# Patient Record
Sex: Female | Born: 2005 | Race: White | Hispanic: No | Marital: Single | Smoking: Never smoker
Health system: Southern US, Community
[De-identification: ages and names within clinical notes are randomized; demographics above are authoritative.]

## PROBLEM LIST (undated history)

## (undated) DIAGNOSIS — F909 Attention-deficit hyperactivity disorder, unspecified type: Secondary | ICD-10-CM

## (undated) DIAGNOSIS — F419 Anxiety disorder, unspecified: Secondary | ICD-10-CM

---
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
## 2024-10-21 ENCOUNTER — Other Ambulatory Visit: Payer: Self-pay

## 2024-10-21 ENCOUNTER — Emergency Department

## 2024-10-21 ENCOUNTER — Emergency Department
Admission: EM | Admit: 2024-10-21 | Discharge: 2024-10-21 | Disposition: A | Discharge status: Home / Self Care | Attending: Emergency Medicine | Admitting: Emergency Medicine

## 2024-10-21 DIAGNOSIS — R7401 Elevation of levels of liver transaminase levels: Secondary | ICD-10-CM | POA: Insufficient documentation

## 2024-10-21 DIAGNOSIS — N12 Tubulo-interstitial nephritis, not specified as acute or chronic: Secondary | ICD-10-CM | POA: Diagnosis not present

## 2024-10-21 DIAGNOSIS — R109 Unspecified abdominal pain: Secondary | ICD-10-CM | POA: Diagnosis present

## 2024-10-21 DIAGNOSIS — R1010 Upper abdominal pain, unspecified: Secondary | ICD-10-CM

## 2024-10-21 DIAGNOSIS — D72829 Elevated white blood cell count, unspecified: Secondary | ICD-10-CM | POA: Diagnosis not present

## 2024-10-21 DIAGNOSIS — R748 Abnormal levels of other serum enzymes: Secondary | ICD-10-CM

## 2024-10-21 LAB — URINALYSIS, ROUTINE W REFLEX MICROSCOPIC
Bilirubin Urine: NEGATIVE
Glucose, UA: NEGATIVE mg/dL
Ketones, ur: NEGATIVE mg/dL
Leukocytes,Ua: NEGATIVE
Nitrite: NEGATIVE
Protein, ur: NEGATIVE mg/dL
Specific Gravity, Urine: 1.016 (ref 1.005–1.030)
pH: 5 (ref 5.0–8.0)

## 2024-10-21 LAB — COMPREHENSIVE METABOLIC PANEL WITH GFR
ALT: 520 U/L — ABNORMAL HIGH (ref 0–44)
AST: 389 U/L — ABNORMAL HIGH (ref 15–41)
Albumin: 4 g/dL (ref 3.5–5.0)
Alkaline Phosphatase: 486 U/L — ABNORMAL HIGH (ref 38–126)
Anion gap: 13 (ref 5–15)
BUN: 12 mg/dL (ref 6–20)
CO2: 22 mmol/L (ref 22–32)
Calcium: 9.2 mg/dL (ref 8.9–10.3)
Chloride: 103 mmol/L (ref 98–111)
Creatinine, Ser: 0.71 mg/dL (ref 0.44–1.00)
GFR, Estimated: >60 mL/min
Glucose, Bld: 97 mg/dL (ref 70–99)
Potassium: 3.8 mmol/L (ref 3.5–5.1)
Sodium: 138 mmol/L (ref 135–145)
Total Bilirubin: 2.5 mg/dL — ABNORMAL HIGH (ref 0.0–1.2)
Total Protein: 8 g/dL (ref 6.5–8.1)

## 2024-10-21 LAB — CBC
HCT: 38.5 % (ref 36.0–46.0)
Hemoglobin: 12.8 g/dL (ref 12.0–15.0)
MCH: 29.4 pg (ref 26.0–34.0)
MCHC: 33.2 g/dL (ref 30.0–36.0)
MCV: 88.5 fL (ref 80.0–100.0)
Platelets: 159 10*3/uL (ref 150–400)
RBC: 4.35 MIL/uL (ref 3.87–5.11)
RDW: 13.5 % (ref 11.5–15.5)
WBC: 18.1 10*3/uL — ABNORMAL HIGH (ref 4.0–10.5)
nRBC: 0 % (ref 0.0–0.2)

## 2024-10-21 LAB — LIPASE, BLOOD: Lipase: 22 U/L (ref 11–51)

## 2024-10-21 LAB — POC URINE PREG, ED: Preg Test, Ur: NEGATIVE

## 2024-10-21 MED ORDER — LEVOFLOXACIN 750 MG PO TABS: 750.0000 mg | ORAL_TABLET | Freq: Every day | ORAL | 0 refills | Status: AC

## 2024-10-21 MED ORDER — ONDANSETRON HCL 4 MG/2ML IJ SOLN
4.0000 mg | Freq: Once | INTRAMUSCULAR | Status: AC
  Administered 2024-10-21: 4 mg via INTRAVENOUS
  Filled 2024-10-21: qty 2

## 2024-10-21 MED ORDER — PIPERACILLIN-TAZOBACTAM 3.375 G IVPB 30 MIN
3.3750 g | Freq: Once | INTRAVENOUS | Status: AC
  Administered 2024-10-21: 3.375 g via INTRAVENOUS
  Filled 2024-10-21: qty 50

## 2024-10-21 MED ORDER — IOHEXOL 300 MG/ML  SOLN
100.0000 mL | Freq: Once | INTRAMUSCULAR | Status: AC | PRN
  Administered 2024-10-21: 100 mL via INTRAVENOUS

## 2024-10-21 MED ORDER — ONDANSETRON 4 MG PO TBDP: 4.0000 mg | ORAL_TABLET | Freq: Three times a day (TID) | ORAL | 0 refills | Status: AC | PRN

## 2024-10-21 MED ORDER — SODIUM CHLORIDE 0.9 % IV BOLUS
1000.0000 mL | Freq: Once | INTRAVENOUS | Status: AC
  Administered 2024-10-21: 1000 mL via INTRAVENOUS

## 2024-10-21 NOTE — ED Notes (Signed)
 US  at beside

## 2024-10-21 NOTE — ED Triage Notes (Signed)
 First nurse note: pt to ED [facility], [facility] for constipation x8 days. +nausea. +h/a

## 2024-10-21 NOTE — ED Triage Notes (Signed)
 Pt sts that she has been having constipation with nausea for the last 8 days.

## 2024-10-21 NOTE — ED Provider Notes (Signed)
 "  [facility] Provider Note    Event Date/Time   First MD Initiated Contact with Patient 10/21/24 1854     (approximate)  History   Chief Complaint: Constipation  HPI  Tiffany Donaldson is a 19 y.o. female with a past medical history of ADHD, anxiety, presents to the emergency department for abdominal discomfort constipation and nausea.  According to the patient for the past 8 or 9 days she has been constipated experiencing vague abdominal pains.  Patient states she continues to have constipation now with nausea abdominal pain is focused more on the right side of her abdomen and tonight had a low-grade temperature to 100.0.  Patient went to [facility] clinic and was sent to the emergency department for further evaluation.  Has also noticed some slight yellowing of her eyes at times, as well as some upper respiratory congestion recently.  Physical Exam   Triage Vital Signs: ED Triage Vitals [10/21/24 1639]  Encounter Vitals Group     BP 131/78     Girls Systolic BP Percentile      Girls Diastolic BP Percentile      Boys Systolic BP Percentile      Boys Diastolic BP Percentile      Pulse Rate 71     Resp 18     Temp 98.8 F (37.1 C)     Temp Source Oral     SpO2 96 %     Weight 140 lb (63.5 kg)     Height 5' 6 (1.676 m)     Head Circumference      Peak Flow      Pain Score 2     Pain Loc      Pain Education      Exclude from Growth Chart     Most recent vital signs: Vitals:   10/21/24 1639  BP: 131/78  Pulse: 71  Resp: 18  Temp: 98.8 F (37.1 C)  SpO2: 96%    General: Awake, no distress.  Very slight scleral icterus. CV:  Good peripheral perfusion.  Regular rate and rhythm  Resp:  Normal effort.  Equal breath sounds bilaterally.  Abd:  No distention.  Soft, mild right-sided abdominal tenderness no rebound or guarding.  ED Results / Procedures / Treatments   RADIOLOGY  Ultrasound pending   MEDICATIONS ORDERED IN ED: Medications - No  data to display   IMPRESSION / MDM / ASSESSMENT AND PLAN / ED COURSE  I reviewed the triage vital signs and the nursing notes.  Patient's presentation is most consistent with acute presentation with potential threat to life or bodily function.  Patient presents emergency department for 8 days of constipation now with some right sided abdominal discomfort nausea and low-grade temperature to 100.0.  Here in the emergency department patient appears well, reassuring physical exam with just very minimal right sided abdominal tenderness.  Vital signs are reassuring, afebrile in the emergency department but had a fever of 100.0 in [place] clinic just before arrival.  Patient's lab work today shows a leukocytosis of 18,000 with a chemistry showing elevated LFTs including a total bilirubin of 2.5.  Lipase is normal.  Concern for possible cholecystitis versus choledocholithiasis versus biliary obstruction.  We will go ahead and cover with IV Zosyn  given the patient's temperature of 100.0 earlier today and leukocytosis of 18,000 to cover for any possible cholecystitis or cholangitis.  Will obtain a right upper quadrant ultrasound to further evaluate.  Patient agreeable to plan of  care and workup.  Ultrasound pending.  Patient care signed out to oncoming provider.  FINAL CLINICAL IMPRESSION(S) / ED DIAGNOSES   Elevated liver function test Abdominal pain   Note:  This document was prepared using Dragon voice recognition software and may include unintentional dictation errors.   Dorothyann Drivers, MD 10/22/24 2340  "

## 2024-10-21 NOTE — ED Provider Notes (Signed)
----------------------------------------- °  8:02 PM on 10/21/2024 -----------------------------------------  Blood pressure 131/78, pulse 71, temperature 98.8 F (37.1 C), temperature source Oral, resp. rate 18, height 5' 6 (1.676 m), weight 63.5 kg, last menstrual period 10/21/2024, SpO2 96%.  Assuming care from Dr. Dorothyann.  In short, Tiffany Donaldson is a 19 y.o. female with a chief complaint of constipation x 8 days.  Refer to the original H&P for additional details.  The current plan of care is to await US  results and proceed based on findings.   Clinical Course as of 10/21/24 2329  Tue Oct 21, 2024  2058 US  negative. Plan will be to get CT abdomen and pelvis with contrast. Upon reassessment, patient lying in bed in relaxed position. Declines pain/nausea medication at this time. Agreeable with plan for CT. [CT]  2325 CT concerning for pyelonephritis, but otherwise negative.  Upon reexamination, patient continues to be overall well-appearing.  Her abdomen remains soft.  She has some diffuse right sided abdominal tenderness that is somewhat worse in the upper quadrant.  Tiffany Donaldson is still negative.  She does state that she feels nauseated.  Zofran  ordered.  She has no back pain or CVA tenderness.  She denies urinary symptoms as well.  She states that she had discussed her constipation with primary care about 3 days ago who advised her to take MiraLAX.  After 1 dose, her stool became soft and it had a claylike appearance.  She did not take any more after that.  Case discussed with oncoming ED attending, Dr. Viviann.  Plan will be to get a urine culture, treat with antibiotic and have her follow-up with her primary care provider for repeat labs and assessment this week if possible.  She will be given strict ER return precautions as well. [CT]    Clinical Course User Index [CT] Tasnia Spegal B, FNP     Medications  ondansetron  (ZOFRAN ) injection 4 mg (has no administration in  time range)  piperacillin -tazobactam (ZOSYN ) IVPB 3.375 g (0 g Intravenous Stopped 10/21/24 2052)  sodium chloride  0.9 % bolus 1,000 mL (0 mLs Intravenous Stopped 10/21/24 2157)  iohexol  (OMNIPAQUE ) 300 MG/ML solution 100 mL (100 mLs Intravenous Contrast Given 10/21/24 2241)     ED Discharge Orders          Ordered    ondansetron  (ZOFRAN -ODT) 4 MG disintegrating tablet  Every 8 hours PRN        10/21/24 2324    levofloxacin  (LEVAQUIN ) 750 MG tablet  Daily        10/21/24 2324           Final diagnoses:  Pyelonephritis  Pain of upper abdomen  Elevated liver enzymes      Herlinda Kirk NOVAK, FNP 10/21/24 2329    Dorothyann Drivers, MD 10/22/24 2330

## 2024-10-21 NOTE — Discharge Instructions (Signed)
 Please call tomorrow to request a follow-up appointment with your primary care provider.  If any of your symptoms change or worsen, please return to the emergency department if unable to see primary care right away.  Take the antibiotic as prescribed until finished.  Do not take acetaminophen /Tylenol  or over-the-counter medications that contain either of the 2.  Do not drink alcohol.

## 2024-10-22 ENCOUNTER — Emergency Department (HOSPITAL_BASED_OUTPATIENT_CLINIC_OR_DEPARTMENT_OTHER)
Admission: EM | Admit: 2024-10-22 | Discharge: 2024-10-23 | Disposition: A | Discharge status: Home / Self Care | Attending: Emergency Medicine | Admitting: Emergency Medicine

## 2024-10-22 ENCOUNTER — Other Ambulatory Visit: Payer: Self-pay

## 2024-10-22 DIAGNOSIS — R42 Dizziness and giddiness: Secondary | ICD-10-CM | POA: Insufficient documentation

## 2024-10-22 DIAGNOSIS — N12 Tubulo-interstitial nephritis, not specified as acute or chronic: Secondary | ICD-10-CM | POA: Insufficient documentation

## 2024-10-22 DIAGNOSIS — R110 Nausea: Secondary | ICD-10-CM | POA: Insufficient documentation

## 2024-10-22 NOTE — ED Triage Notes (Signed)
 POV nausea and vomiting last 6 days, dizziness started to get worse tonight.  Seen at [facility] yesterday Dx with pyelonephritis and given zofran  and Levaquin  at D/c

## 2024-10-23 LAB — COMPREHENSIVE METABOLIC PANEL WITH GFR
ALT: 442 U/L — ABNORMAL HIGH (ref 0–44)
AST: 273 U/L — ABNORMAL HIGH (ref 15–41)
Albumin: 4 g/dL (ref 3.5–5.0)
Alkaline Phosphatase: 512 U/L — ABNORMAL HIGH (ref 38–126)
Anion gap: 12 (ref 5–15)
BUN: 10 mg/dL (ref 6–20)
CO2: 25 mmol/L (ref 22–32)
Calcium: 9.2 mg/dL (ref 8.9–10.3)
Chloride: 100 mmol/L (ref 98–111)
Creatinine, Ser: 0.72 mg/dL (ref 0.44–1.00)
GFR, Estimated: >60 mL/min
Glucose, Bld: 90 mg/dL (ref 70–99)
Potassium: 3.5 mmol/L (ref 3.5–5.1)
Sodium: 137 mmol/L (ref 135–145)
Total Bilirubin: 1.8 mg/dL — ABNORMAL HIGH (ref 0.0–1.2)
Total Protein: 7.5 g/dL (ref 6.5–8.1)

## 2024-10-23 LAB — CBC WITH DIFFERENTIAL/PLATELET
Abs Immature Granulocytes: 0.57 10*3/uL — ABNORMAL HIGH (ref 0.00–0.07)
Basophils Absolute: 0.2 10*3/uL — ABNORMAL HIGH (ref 0.0–0.1)
Basophils Relative: 1 %
Eosinophils Absolute: 0.2 10*3/uL (ref 0.0–0.5)
Eosinophils Relative: 2 %
HCT: 37 % (ref 36.0–46.0)
Hemoglobin: 12.4 g/dL (ref 12.0–15.0)
Immature Granulocytes: 4 %
Lymphocytes Relative: 63 %
Lymphs Abs: 8.3 10*3/uL — ABNORMAL HIGH (ref 0.7–4.0)
MCH: 29.7 pg (ref 26.0–34.0)
MCHC: 33.5 g/dL (ref 30.0–36.0)
MCV: 88.7 fL (ref 80.0–100.0)
Monocytes Absolute: 1.1 10*3/uL — ABNORMAL HIGH (ref 0.1–1.0)
Monocytes Relative: 8 %
Neutro Abs: 2.8 10*3/uL (ref 1.7–7.7)
Neutrophils Relative %: 22 %
Platelets: 156 10*3/uL (ref 150–400)
RBC: 4.17 MIL/uL (ref 3.87–5.11)
RDW: 13.9 % (ref 11.5–15.5)
WBC: 13.2 10*3/uL — ABNORMAL HIGH (ref 4.0–10.5)
nRBC: 0 % (ref 0.0–0.2)

## 2024-10-23 LAB — URINE CULTURE: Culture: NO GROWTH

## 2024-10-23 LAB — LIPASE, BLOOD: Lipase: 21 U/L (ref 11–51)

## 2024-10-23 MED ORDER — SODIUM CHLORIDE 0.9 % IV BOLUS
1000.0000 mL | Freq: Once | INTRAVENOUS | Status: AC
  Administered 2024-10-23: 1000 mL via INTRAVENOUS

## 2024-10-23 MED ORDER — ONDANSETRON HCL 4 MG/2ML IJ SOLN
4.0000 mg | Freq: Once | INTRAMUSCULAR | Status: AC
  Administered 2024-10-23: 4 mg via INTRAVENOUS
  Filled 2024-10-23: qty 2

## 2024-10-23 NOTE — Discharge Instructions (Signed)
 Continue taking Zofran  and Levaquin  as previously prescribed.  Drink plenty of fluids and get plenty of rest.  Follow-up with your primary doctor in the next 1 to 2 weeks for a repeat of your liver function tests.  Return to the ER in the meantime if you develop any new and/or concerning issues.

## 2024-10-23 NOTE — ED Provider Notes (Signed)
 " [place] EMERGENCY DEPARTMENT AT [facility] Provider Note   CSN: [id number] Arrival date & time: 10/22/24  2303     Patient presents with: Nausea   Tiffany Donaldson is a 19 y.o. female.   Patient is a 19 year old female with history of anxiety and ADHD.  Patient presenting to the emergency department with complaints of nausea and dizziness.  She was diagnosed yesterday with pyelonephritis at [facility].  She was given Levaquin , started on Levaquin , and discharged home.  While she was there she was also found to have elevated transaminases, alk phos, and bilirubin, but CT scan showed no other acute findings.  Since returning home, she has been experiencing episodes of nausea and some dizziness when she stands.  She denies any chest pain or palpitations.  She describes this dizziness as a lightheadedness and not a room spinning type sensation.  She also describes episodes of constipation for which she has been taking MiraLAX and stool softeners.       Prior to Admission medications  Medication Sig Start Date End Date Taking? Authorizing Provider  levofloxacin  (LEVAQUIN ) 750 MG tablet Take 1 tablet (750 mg total) by mouth daily for 7 days. 10/21/24 10/28/24 Yes Triplett, Cari B, FNP  ondansetron  (ZOFRAN -ODT) 4 MG disintegrating tablet Take 1 tablet (4 mg total) by mouth every 8 (eight) hours as needed for nausea or vomiting. 10/21/24  Yes Triplett, Cari B, FNP  gabapentin  (NEURONTIN ) 300 MG capsule Take 300 mg by mouth at bedtime.    [provider]  ibuprofen (ADVIL) 800 MG tablet Take 800 mg by mouth every 8 (eight) hours as needed for moderate pain.    [provider]  oxyCODONE -acetaminophen  (PERCOCET/ROXICET) 5-325 MG tablet Take 1 tablet by mouth every 8 (eight) hours as needed for severe pain.    [provider]    Allergies: Patient has no known allergies.    Review of Systems  All other systems reviewed and are negative.   Updated Vital  Signs BP 106/81 (BP Location: Right Arm)   Pulse 61   Temp 98.4 F (36.9 C) (Oral)   LMP 10/21/2024 (Exact Date)   SpO2 100%   Physical Exam Vitals and nursing note reviewed.  Constitutional:      General: She is not in acute distress.    Appearance: She is well-developed. She is not diaphoretic.  HENT:     Head: Normocephalic and atraumatic.  Cardiovascular:     Rate and Rhythm: Normal rate and regular rhythm.     Heart sounds: No murmur heard.    No friction rub. No gallop.  Pulmonary:     Effort: Pulmonary effort is normal. No respiratory distress.     Breath sounds: Normal breath sounds. No wheezing.  Abdominal:     General: Bowel sounds are normal. There is no distension.     Palpations: Abdomen is soft.     Tenderness: There is no abdominal tenderness.  Musculoskeletal:        General: Normal range of motion.     Cervical back: Normal range of motion and neck supple.  Skin:    General: Skin is warm and dry.  Neurological:     General: No focal deficit present.     Mental Status: She is alert and oriented to person, place, and time.     (all labs ordered are listed, but only abnormal results are displayed) Labs Reviewed - No data to display  EKG: None  Radiology: CT ABDOMEN  PELVIS W CONTRAST Result Date: 10/21/2024 EXAM: CT ABDOMEN AND PELVIS WITH CONTRAST 10/21/2024 10:46:06 PM TECHNIQUE: CT of the abdomen and pelvis was performed with the administration of 100 mL iohexol  (OMNIPAQUE ) 300 MG/ML solution. Multiplanar reformatted images are provided for review. Automated exposure control, iterative reconstruction, and/or weight-based adjustment of the mA/kV was utilized to reduce the radiation dose to as low as reasonably achievable. COMPARISON: None available. CLINICAL HISTORY: Acute abdominal pain. FINDINGS: LOWER CHEST: No acute abnormality. LIVER: The liver is unremarkable. GALLBLADDER AND BILE DUCTS: Gallbladder is unremarkable. No biliary ductal dilatation.  SPLEEN: No acute abnormality. PANCREAS: No acute abnormality. ADRENAL GLANDS: No acute abnormality. KIDNEYS, URETERS AND BLADDER: Kidneys demonstrate patchy areas of decreased attenuation bilaterally, suspicious for pyelonephritis. No obstructive changes are seen. No ureteral enhancement is noted. The bladder is partially distended. No stones in the kidneys or ureters. No perinephric or periureteral stranding. GI AND BOWEL: Stomach demonstrates no acute abnormality. There is no bowel obstruction. The appendix is not visualized, consistent with prior surgical history. PERITONEUM AND RETROPERITONEUM: Free fluid is noted within the pelvis, likely physiologic in nature. No free air. VASCULATURE: Aorta is normal in caliber. LYMPH NODES: No lymphadenopathy. REPRODUCTIVE ORGANS: No acute abnormality. BONES AND SOFT TISSUES: No acute osseous abnormality. No focal soft tissue abnormality. IMPRESSION: 1. Bilateral patchy areas of decreased attenuation in the kidneys, suspicious for pyelonephritis, without obstructive changes or ureteral enhancement. Electronically signed by: Oneil Devonshire MD 10/21/2024 10:57 PM EST RP Workstation: [id number]   US  ABDOMEN LIMITED RUQ (LIVER/GB) Result Date: 10/21/2024 CLINICAL DATA:  Elevated LFT EXAM: ULTRASOUND ABDOMEN LIMITED RIGHT UPPER QUADRANT COMPARISON:  None Available. FINDINGS: Gallbladder: Contracted gallbladder. No shadowing stones. Normal wall thickness. Negative sonographic Murphy Common bile duct: Diameter: 2.4 mm Liver: No focal lesion identified. Within normal limits in parenchymal echogenicity. Portal vein is patent on color Doppler imaging with normal direction of blood flow towards the liver. Other: None. IMPRESSION: Negative examination. Electronically Signed   By: Luke Bun M.D.   On: 10/21/2024 20:21     Procedures   Medications Ordered in the ED  ondansetron  (ZOFRAN ) injection 4 mg (has no administration in time range)  sodium chloride  0.9 % bolus 1,000 mL  (has no administration in time range)                                    Medical Decision Making Amount and/or Complexity of Data Reviewed Labs: ordered.  Risk Prescription drug management.   Patient is a 19 year old female presenting with nausea and dizziness as described in the HPI.  She arrives here with stable vital signs and is afebrile.  Physical examination is unremarkable and abdomen is benign.  She is neurologically intact.  Laboratory studies obtained including CBC, CMP, and lipase.  White count is 13,000 with elevations of her transaminases, alk phos, and total bilirubin.  Patient has been given normal saline along with Zofran  for nausea and seems to be feeling better.  She was diagnosed with pyelonephritis yesterday at [facility].  In reviewing her test results with her, her leukocytosis has improved from 18-13 and LFTs are also improved from prior studies.  Things seem to be trending in the right direction, so I feel as though patient can safely be discharged.  I will have her continue her Levaquin  and prescribe Zofran  which she can take for nausea.  To follow-up with primary doctor for repeat LFTs in the  near future.     Final diagnoses:  None    ED Discharge Orders     None          Geroldine Berg, MD 10/23/24 0154  "
